# Patient Record
Sex: Male | Born: 1985 | Race: White | Hispanic: No | Marital: Married | Smoking: Never smoker
Health system: Southern US, Community
[De-identification: ages and names within clinical notes are randomized; demographics above are authoritative.]

## PROBLEM LIST (undated history)

## (undated) DIAGNOSIS — R0981 Nasal congestion: Secondary | ICD-10-CM

## (undated) DIAGNOSIS — K219 Gastro-esophageal reflux disease without esophagitis: Secondary | ICD-10-CM

## (undated) HISTORY — PX: WISDOM TOOTH EXTRACTION: SHX21

---

## 2015-02-24 ENCOUNTER — Encounter: Payer: Self-pay | Admitting: *Deleted

## 2015-02-25 ENCOUNTER — Encounter
Admission: RE | Disposition: A | Discharge status: Home / Self Care | Payer: BC Managed Care – PPO | Source: Ambulatory Visit | Attending: Gastroenterology

## 2015-02-25 ENCOUNTER — Ambulatory Visit: Payer: BC Managed Care – PPO | Admitting: Student in an Organized Health Care Education/Training Program

## 2015-02-25 ENCOUNTER — Ambulatory Visit
Admission: RE | Admit: 2015-02-25 | Discharge: 2015-02-25 | Disposition: A | Discharge status: Home / Self Care | Payer: BC Managed Care – PPO | Source: Ambulatory Visit | Attending: Gastroenterology | Admitting: Gastroenterology

## 2015-02-25 ENCOUNTER — Encounter: Payer: Self-pay | Admitting: *Deleted

## 2015-02-25 DIAGNOSIS — K2950 Unspecified chronic gastritis without bleeding: Secondary | ICD-10-CM | POA: Insufficient documentation

## 2015-02-25 DIAGNOSIS — R109 Unspecified abdominal pain: Secondary | ICD-10-CM | POA: Diagnosis present

## 2015-02-25 DIAGNOSIS — Z79899 Other long term (current) drug therapy: Secondary | ICD-10-CM | POA: Insufficient documentation

## 2015-02-25 DIAGNOSIS — K219 Gastro-esophageal reflux disease without esophagitis: Secondary | ICD-10-CM | POA: Insufficient documentation

## 2015-02-25 HISTORY — DX: Gastro-esophageal reflux disease without esophagitis: K21.9

## 2015-02-25 HISTORY — DX: Nasal congestion: R09.81

## 2015-02-25 HISTORY — PX: ESOPHAGOGASTRODUODENOSCOPY: SHX5428

## 2015-02-25 SURGERY — EGD (ESOPHAGOGASTRODUODENOSCOPY)
Anesthesia: Monitor Anesthesia Care | Wound class: Clean Contaminated

## 2015-02-25 MED ORDER — LIDOCAINE HCL (CARDIAC) 20 MG/ML IV SOLN
INTRAVENOUS | Status: DC | PRN
  Administered 2015-02-25: 50 mg via INTRAVENOUS

## 2015-02-25 MED ORDER — LACTATED RINGERS IV SOLN
INTRAVENOUS | Status: DC
  Administered 2015-02-25: 11:00:00 via INTRAVENOUS

## 2015-02-25 MED ORDER — GLYCOPYRROLATE 0.2 MG/ML IJ SOLN
INTRAMUSCULAR | Status: DC | PRN
  Administered 2015-02-25: 0.1 mg via INTRAVENOUS

## 2015-02-25 MED ORDER — SIMETHICONE 40 MG/0.6ML PO SUSP
ORAL | Status: DC | PRN
  Administered 2015-02-25: 12:00:00

## 2015-02-25 MED ORDER — LACTATED RINGERS IV SOLN: 500.0000 mL | INTRAVENOUS | Status: DC

## 2015-02-25 MED ORDER — PROPOFOL 10 MG/ML IV BOLUS
INTRAVENOUS | Status: DC | PRN
  Administered 2015-02-25: 150 mg via INTRAVENOUS
  Administered 2015-02-25: 20 mg via INTRAVENOUS
  Administered 2015-02-25: 50 mg via INTRAVENOUS

## 2015-02-25 SURGICAL SUPPLY — 41 items
BALLN DILATOR 10-12 8 (BALLOONS)
BALLN DILATOR 12-15 8 (BALLOONS)
BALLN DILATOR 15-18 8 (BALLOONS)
BALLN DILATOR CRE 0-12 8 (BALLOONS)
BALLN DILATOR ESOPH 8 10 CRE (MISCELLANEOUS) IMPLANT
BALLOON DILATOR 12-15 8 (BALLOONS) IMPLANT
BALLOON DILATOR 15-18 8 (BALLOONS) IMPLANT
BALLOON DILATOR CRE 0-12 8 (BALLOONS) IMPLANT
BLOCK BITE 60FR ADLT L/F GRN (MISCELLANEOUS) ×3 IMPLANT
CANISTER SUCT 1200ML W/VALVE (MISCELLANEOUS) ×3 IMPLANT
FCP ESCP3.2XJMB 240X2.8X (MISCELLANEOUS)
FORCEPS BIOP RAD 4 LRG CAP 4 (CUTTING FORCEPS) ×3 IMPLANT
FORCEPS BIOP RJ4 240 W/NDL (MISCELLANEOUS)
FORCEPS ESCP3.2XJMB 240X2.8X (MISCELLANEOUS) IMPLANT
GOWN CVR UNV OPN BCK APRN NK (MISCELLANEOUS) ×1 IMPLANT
GOWN ISOL THUMB LOOP REG UNIV (MISCELLANEOUS) ×2
GOWN STRL REUS W/ TWL LRG LVL3 (GOWN DISPOSABLE) ×1 IMPLANT
GOWN STRL REUS W/TWL LRG LVL3 (GOWN DISPOSABLE) ×2
HEMOCLIP INSTINCT (CLIP) IMPLANT
INJECTOR VARIJECT VIN23 (MISCELLANEOUS) IMPLANT
KIT CO2 TUBING (TUBING) IMPLANT
KIT DEFENDO VALVE AND CONN (KITS) IMPLANT
KIT ENDO PROCEDURE OLY (KITS) ×3 IMPLANT
LIGATOR MULTIBAND 6SHOOTER MBL (MISCELLANEOUS) IMPLANT
MARKER SPOT ENDO TATTOO 5ML (MISCELLANEOUS) IMPLANT
PAD GROUND ADULT SPLIT (MISCELLANEOUS) IMPLANT
SNARE SHORT THROW 13M SML OVAL (MISCELLANEOUS) IMPLANT
SNARE SHORT THROW 30M LRG OVAL (MISCELLANEOUS) IMPLANT
SPOT EX ENDOSCOPIC TATTOO (MISCELLANEOUS)
SUCTION POLY TRAP 4CHAMBER (MISCELLANEOUS) IMPLANT
SYR INFLATION 60ML (SYRINGE) IMPLANT
TRAP SUCTION POLY (MISCELLANEOUS) IMPLANT
TUBING CONN 6MMX3.1M (TUBING)
TUBING SUCTION CONN 0.25 STRL (TUBING) IMPLANT
UNDERPAD 30X60 958B10 (PK) (MISCELLANEOUS) IMPLANT
VALVE BIOPSY ENDO (VALVE) IMPLANT
VARIJECT INJECTOR VIN23 (MISCELLANEOUS)
WATER AUXILLARY (MISCELLANEOUS) IMPLANT
WATER STERILE IRR 250ML POUR (IV SOLUTION) ×3 IMPLANT
WATER STERILE IRR 500ML POUR (IV SOLUTION) IMPLANT
WIRE CRE 18-20MM 8CM F G (MISCELLANEOUS) IMPLANT

## 2015-02-25 NOTE — Anesthesia Preprocedure Evaluation (Signed)
Anesthesia Evaluation  Patient identified by MRN, date of birth, ID band Patient awake    Reviewed: Allergy & Precautions, H&P , NPO status , Patient's Chart, lab work & pertinent test results, reviewed documented beta blocker date and time   Airway Mallampati: II  TM Distance: >3 FB Neck ROM: full    Dental no notable dental hx.    Pulmonary neg pulmonary ROS,    Pulmonary exam normal breath sounds clear to auscultation       Cardiovascular Exercise Tolerance: Good negative cardio ROS   Rhythm:regular Rate:Normal     Neuro/Psych negative neurological ROS  negative psych ROS   GI/Hepatic Neg liver ROS, GERD  Medicated,  Endo/Other  negative endocrine ROS  Renal/GU negative Renal ROS  negative genitourinary   Musculoskeletal   Abdominal   Peds  Hematology negative hematology ROS (+)   Anesthesia Other Findings   Reproductive/Obstetrics negative OB ROS                             Anesthesia Physical Anesthesia Plan  ASA: II  Anesthesia Plan: MAC   Post-op Pain Management:    Induction:   Airway Management Planned:   Additional Equipment:   Intra-op Plan:   Post-operative Plan:   Informed Consent: I have reviewed the patients History and Physical, chart, labs and discussed the procedure including the risks, benefits and alternatives for the proposed anesthesia with the patient or authorized representative who has indicated his/her understanding and acceptance.     Plan Discussed with: CRNA  Anesthesia Plan Comments:         Anesthesia Quick Evaluation

## 2015-02-25 NOTE — Op Note (Signed)
[facility] Patient Name: Raymond Mooney Procedure Date: 02/25/2015 11:23 AM MRN: [id number] Account #: [id number] Date of Birth: 1985-07-19 Admit Type: Outpatient Age: 29 Room: [facility] OR ROOM 01 Gender: Male Note Status: Finalized Procedure:         Upper GI endoscopy Indications:       Abdominal pain, Suspected esophageal reflux, Nausea Providers:         Ezzard Standing. Bluford Kaufmann, MD Medicines:         Monitored Anesthesia Care Complications:     No immediate complications. Procedure:         Pre-Anesthesia Assessment:                    - Prior to the procedure, a History and Physical was                     performed, and patient medications, allergies and                     sensitivities were reviewed. The patient's tolerance of                     previous anesthesia was reviewed.                    - The risks and benefits of the procedure and the sedation                     options and risks were discussed with the patient. All                     questions were answered and informed consent was obtained.                    - After reviewing the risks and benefits, the patient was                     deemed in satisfactory condition to undergo the procedure.                    After obtaining informed consent, the endoscope was passed                     under direct vision. Throughout the procedure, the                     patient's blood pressure, pulse, and oxygen saturations                     were monitored continuously. The Olympus GIF H180J                     endoscope (S#: [id number]) was introduced through the mouth,                     and advanced to the second part of duodenum. The upper GI                     endoscopy was accomplished without difficulty. The patient                     tolerated the procedure well. Findings:      The examined esophagus was normal. Biopsies were taken with a cold       forceps for  histology.      The  entire examined stomach was normal. Biopsies were taken with a cold       forceps for histology.      The examined duodenum was normal. Impression:        - Normal esophagus. Biopsied.                    - Normal stomach. Biopsied.                    - Normal examined duodenum. Recommendation:    - Discharge patient to home.                    - Observe patient's clinical course.                    - Await pathology results.                    - Continue present medications.                    - The findings and recommendations were discussed with the                     patient. Procedure Code(s): --- Professional ---                    [phone], Esophagogastroduodenoscopy, flexible, transoral;                     with biopsy, single or multiple Diagnosis Code(s): --- Professional ---                    R10.9, Unspecified abdominal pain                    R11.0, Nausea CPT copyright 2014 American Medical Association. All rights reserved. The codes documented in this report are preliminary and upon coder review may  be revised to meet current compliance requirements. Wallace CullensPaul Y Auren Valdes, MD 02/25/2015 11:39:24 AM This report has been signed electronically. Number of Addenda: 0 Note Initiated On: 02/25/2015 11:23 AM Total Procedure Duration: 0 hours 2 minutes 12 seconds       [facility]

## 2015-02-25 NOTE — Discharge Instructions (Signed)

## 2015-02-25 NOTE — Anesthesia Postprocedure Evaluation (Signed)
Anesthesia Post Note  Patient: Raymond Mooney  Procedure(s) Performed: Procedure(s) (LRB): ESOPHAGOGASTRODUODENOSCOPY (EGD) (N/A)  Patient location during evaluation: PACU Anesthesia Type: MAC Level of consciousness: awake and alert Pain management: pain level controlled Vital Signs Assessment: post-procedure vital signs reviewed and stable Respiratory status: spontaneous breathing, nonlabored ventilation, respiratory function stable and patient connected to nasal cannula oxygen Cardiovascular status: blood pressure returned to baseline and stable Postop Assessment: no signs of nausea or vomiting Anesthetic complications: no    DANIEL D KOVACS

## 2015-02-25 NOTE — Anesthesia Procedure Notes (Signed)
Procedure Name: MAC Performed by: Yvette Loveless Pre-anesthesia Checklist: Patient identified, Emergency Drugs available, Suction available, Timeout performed and Patient being monitored Patient Re-evaluated:Patient Re-evaluated prior to inductionOxygen Delivery Method: Nasal cannula Placement Confirmation: positive ETCO2     

## 2015-02-25 NOTE — Transfer of Care (Signed)
Immediate Anesthesia Transfer of Care Note  Patient: Raymond Mooney  Procedure(s) Performed: Procedure(s): ESOPHAGOGASTRODUODENOSCOPY (EGD) (N/A)  Patient Location: PACU  Anesthesia Type: MAC  Level of Consciousness: awake, alert  and patient cooperative  Airway and Oxygen Therapy: Patient Spontanous Breathing and Patient connected to supplemental oxygen  Post-op Assessment: Post-op Vital signs reviewed, Patient's Cardiovascular Status Stable, Respiratory Function Stable, Patent Airway and No signs of Nausea or vomiting  Post-op Vital Signs: Reviewed and stable  Complications: No apparent anesthesia complications

## 2015-02-25 NOTE — H&P (Signed)
  Date of Initial H&P: 02/18/2015  History reviewed, patient examined, no change in status, stable for surgery. 

## 2015-02-26 ENCOUNTER — Encounter: Payer: Self-pay | Admitting: Gastroenterology

## 2015-02-27 LAB — SURGICAL PATHOLOGY

## 2015-03-12 ENCOUNTER — Other Ambulatory Visit: Payer: Self-pay | Admitting: Gastroenterology

## 2015-03-12 DIAGNOSIS — R109 Unspecified abdominal pain: Secondary | ICD-10-CM

## 2015-03-17 ENCOUNTER — Ambulatory Visit
Admission: RE | Admit: 2015-03-17 | Discharge: 2015-03-17 | Disposition: A | Discharge status: Home / Self Care | Payer: BC Managed Care – PPO | Source: Ambulatory Visit | Attending: Gastroenterology | Admitting: Gastroenterology

## 2015-03-17 DIAGNOSIS — R109 Unspecified abdominal pain: Secondary | ICD-10-CM | POA: Insufficient documentation

## 2015-03-17 MED ORDER — IOHEXOL 350 MG/ML SOLN
100.0000 mL | Freq: Once | INTRAVENOUS | Status: AC | PRN
  Administered 2015-03-17: 100 mL via INTRAVENOUS

## 2016-06-12 IMAGING — CT CT ABD-PELV W/ CM
2 of 4 series · 17 of 46 positions shown, 19 images · IV contrast (omnipaque)
Comparison: None available

CLINICAL DATA: Persistent nausea and lower abdominal pain for 3
months.

EXAM:
CT ABDOMEN AND PELVIS WITH CONTRAST
TECHNIQUE: Multidetector CT imaging of the abdomen and pelvis was performed
using the standard protocol following bolus administration of
intravenous contrast.
CONTRAST:  100mL OMNIPAQUE IOHEXOL 350 MG/ML SOLN

[Series 2: routine with · axial · 0.86mm/px · z∈[-1046,-590]mm · 14 of 101 slices shown, 16 images]
[im 5/101  soft-tissue]
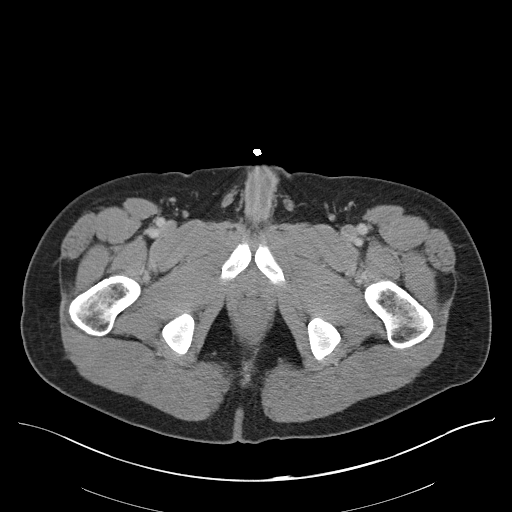
[im 5/101  bone]
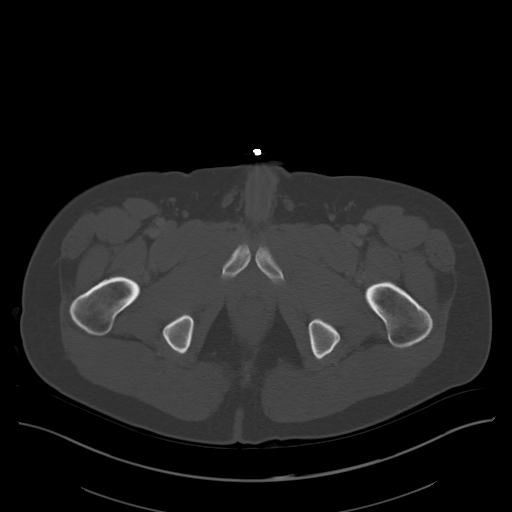
[im 14/101  soft-tissue]
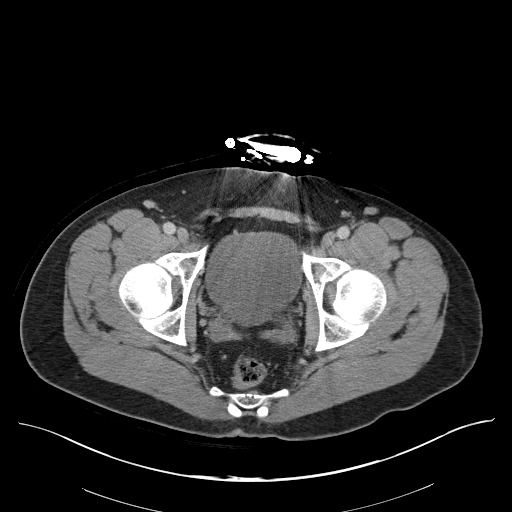
[im 19/101  soft-tissue]
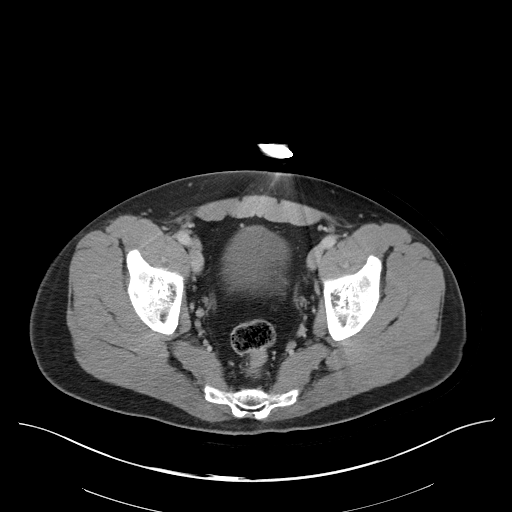
[im 28/101  soft-tissue]
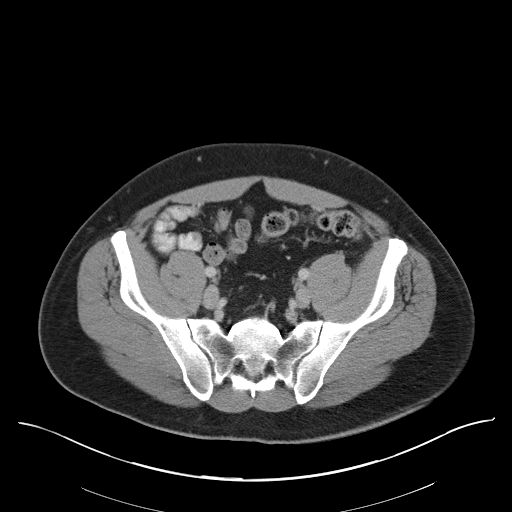
[im 32/101  soft-tissue]
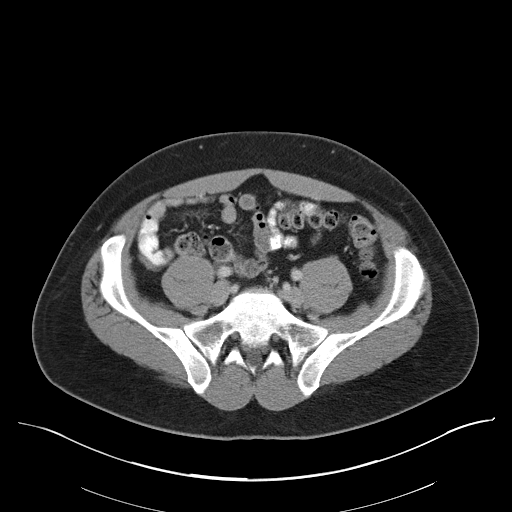
[im 41/101  soft-tissue]
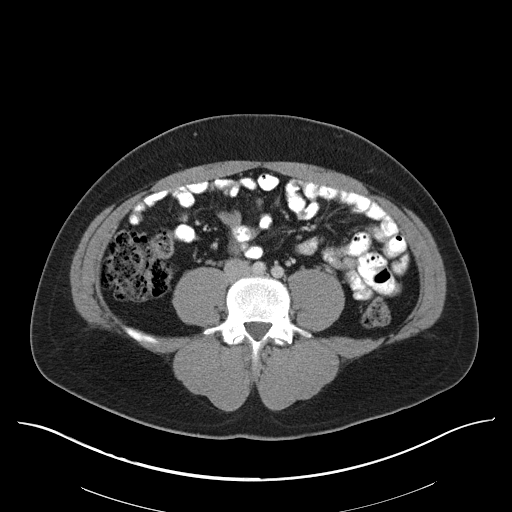
[im 46/101  soft-tissue]
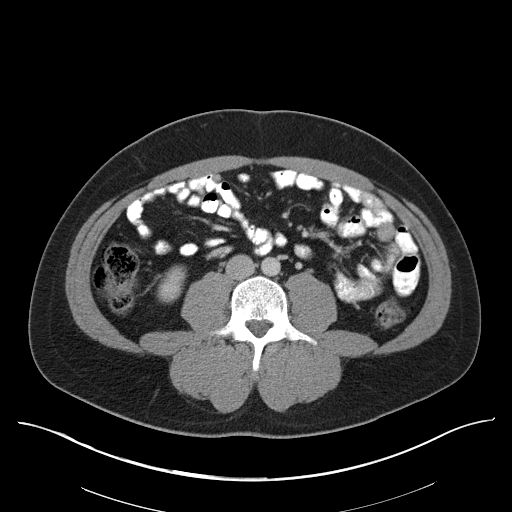
[im 55/101  soft-tissue]
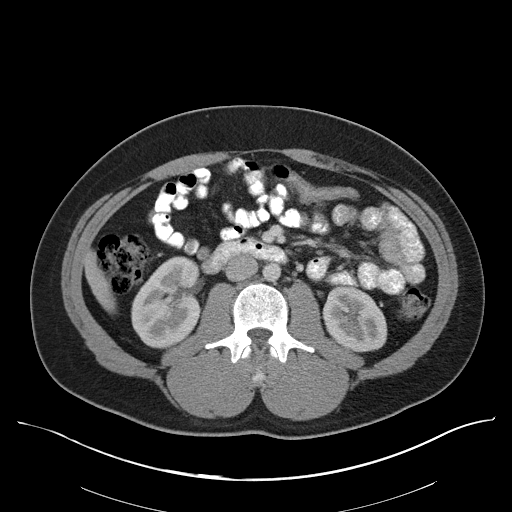
[im 60/101  soft-tissue]
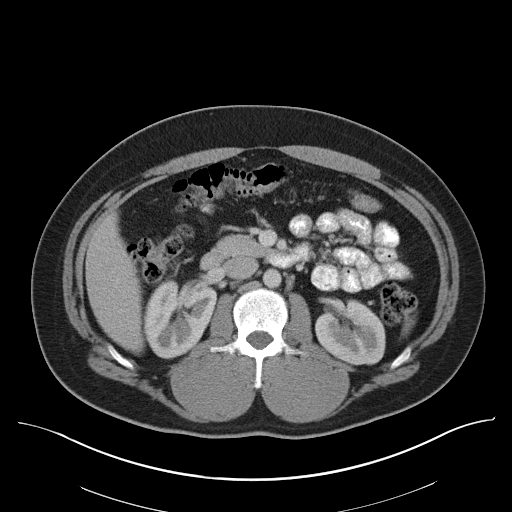
[im 60/101  bone]
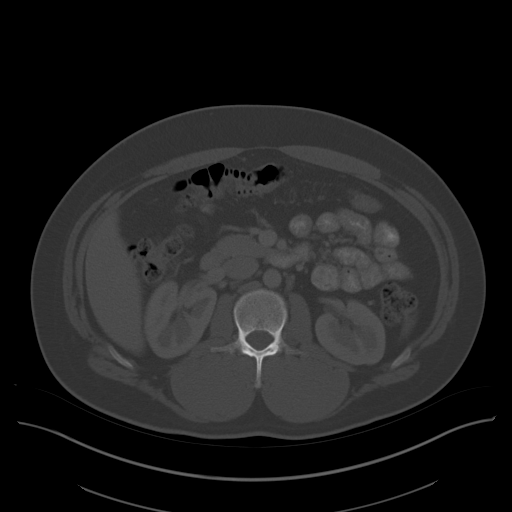
[im 69/101  soft-tissue]
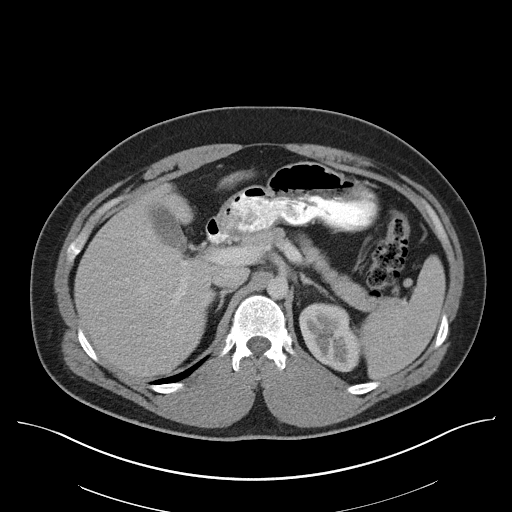
[im 73/101  soft-tissue]
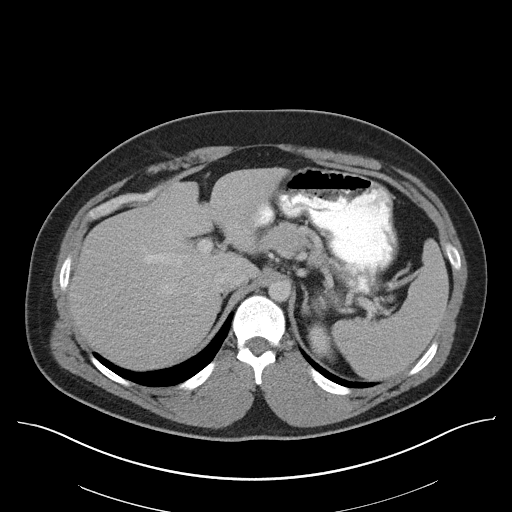
[im 82/101  soft-tissue]
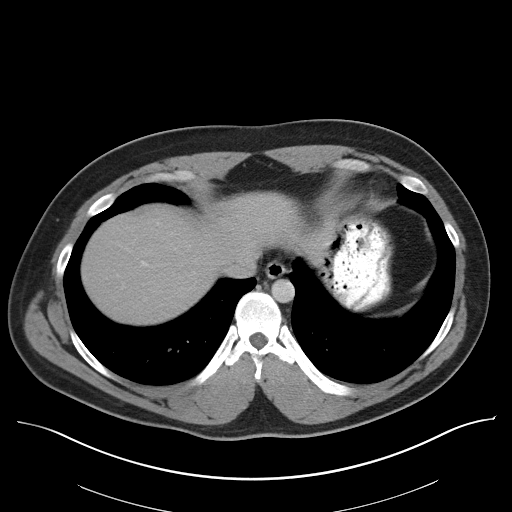
[im 87/101  soft-tissue]
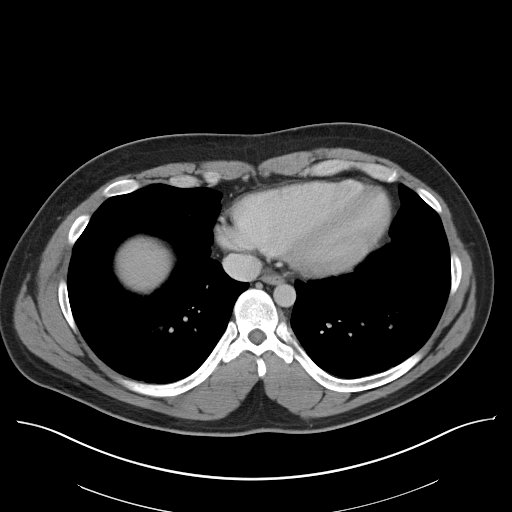
[im 96/101  soft-tissue]
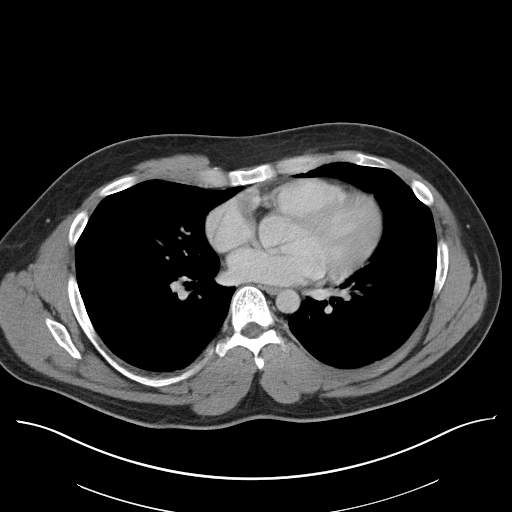

[Series 5: cor routine with · coronal · 0.88mm/px · 3 of 159 slices shown]
[im 53/159  soft-tissue]
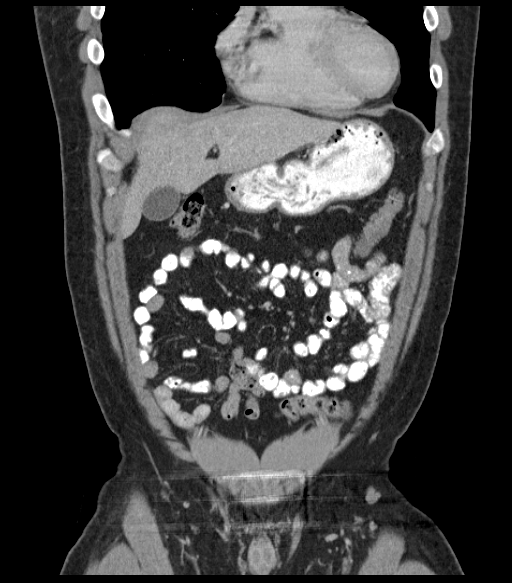
[im 71/159  soft-tissue]
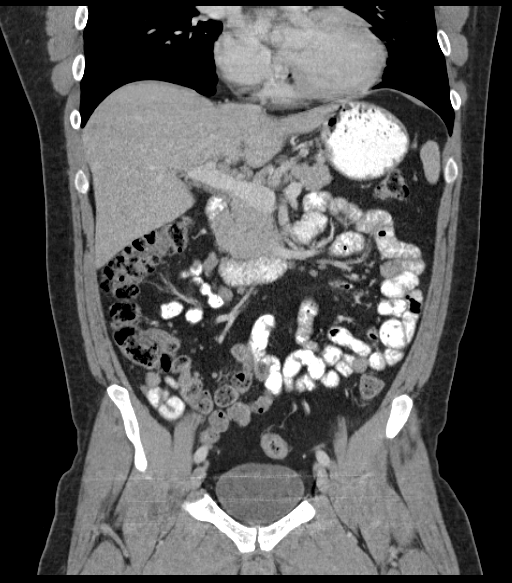
[im 88/159  soft-tissue]
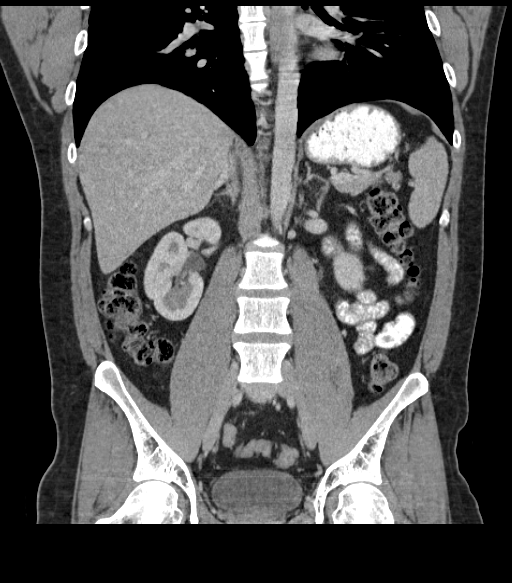

[17 of 46 positions shown; findings below may reference images not displayed]

FINDINGS: Lower chest:  No acute findings.

Hepatobiliary: No masses or other significant abnormality.

Pancreas: No mass, inflammatory changes, or other significant
abnormality.

Spleen: Within normal limits in size and appearance.

Adrenals/Urinary Tract: No masses identified. No evidence of
hydronephrosis.

Stomach/Bowel: Negative for bowel obstruction, significant
dilatation, ileus, or free air. Portion of the transverse colon are
underdistended. Normal appendix demonstrated. No acute inflammatory
process, fluid collection or abscess.

Vascular/Lymphatic: No pathologically enlarged lymph nodes. No
evidence of abdominal aortic aneurysm.

Reproductive: No mass or other significant abnormality.

Other: No inguinal abnormality or hernia.  Intact abdominal wall.

Musculoskeletal: No acute or abnormal osseous finding. External
artifact overlies anterior pelvis.
IMPRESSION: No acute intra-abdominal or pelvic finding.

## 2018-05-30 ENCOUNTER — Telehealth: Payer: BC Managed Care – PPO | Admitting: Family Medicine

## 2018-05-30 ENCOUNTER — Encounter: Payer: Self-pay | Admitting: Family Medicine

## 2018-05-30 DIAGNOSIS — R509 Fever, unspecified: Secondary | ICD-10-CM

## 2018-05-30 DIAGNOSIS — R519 Headache, unspecified: Secondary | ICD-10-CM

## 2018-05-30 DIAGNOSIS — Z7189 Other specified counseling: Secondary | ICD-10-CM

## 2018-05-30 DIAGNOSIS — R51 Headache: Secondary | ICD-10-CM

## 2018-05-30 NOTE — Progress Notes (Signed)
Based on your current symptoms, it seems unlikely that your symptoms are related to the Coronavirus.    At least 5-10 min. Were spent on this patient encounter through record and history review, suspected diagnosis research and response to patient.  Coronavirus disease 2019 (COVID-19) is a respiratory illness that can spread from person to person. The virus that causes COVID-19 is a new virus that was first identified in the country of [place] but is now found in multiple other countries and has spread to the [place].  Symptoms associated with the virus are mild to severe fever, cough, and shortness of breath. There is currently no vaccine to protect against COVID-19, and there is no specific antiviral treatment for the virus.   To be considered HIGH RISK for Coronavirus (COVID-19), you have to meet the following criteria:  . Traveled to [place], [place], [place], [place] or [place]; or in the [place] to [place], [place], [place], or [place]; and have fever, cough, and shortness of breath within the last 2 weeks of travel OR  . Been in close contact with a person diagnosed with COVID-19 within the last 2 weeks and have fever, cough, and shortness of breath  . IF YOU DO NOT MEET THESE CRITERIA, YOU ARE CONSIDERED LOW RISK FOR COVID-19.   It is vitally important that if you feel that you have an infection such as this virus or any other virus that you stay home and away from places where you may spread it to others.  You should self-quarantine for 14 days if you have symptoms that could potentially be coronavirus and avoid contact with people age 86 and older.   You may also take acetaminophen (Tylenol) as needed for fever, and headache.   Reduce your risk of any infection by using the same precautions used for avoiding the common cold or flu:  Marland Kitchen Wash your hands often with soap and warm water for at least 20 seconds.  If soap and water are not readily available, use an alcohol-based  hand sanitizer with at least 60% alcohol.  . If coughing or sneezing, cover your mouth and nose by coughing or sneezing into the elbow areas of your shirt or coat, into a tissue or into your sleeve (not your hands). . Avoid shaking hands with others and consider head nods or verbal greetings only. . Avoid touching your eyes, nose, or mouth with unwashed hands.  . Avoid close contact with people who are sick. . Avoid places or events with large numbers of people in one location, like concerts or sporting events. . Carefully consider travel plans you have or are making. . If you are planning any travel outside or inside the [place], visit the CDC's Travelers' Health webpage for the latest health notices. . If you have some symptoms but not all symptoms, continue to monitor at home and seek medical attention if your symptoms worsen. . If you are having a medical emergency, call 911.  HOME CARE . Only take medications as instructed by your medical team. . Drink plenty of fluids and get plenty of rest. . A steam or ultrasonic humidifier can help if you have congestion.   GET HELP RIGHT AWAY IF: . You develop worsening fever. . You become short of breath . You cough up blood. . Your symptoms become more severe MAKE SURE YOU   Understand these instructions.  Will watch your condition.  Will get help right away if you are not doing well  or get worse.  Your e-visit answers were reviewed by a board certified advanced clinical practitioner to complete your personal care plan.  Depending on the condition, your plan could have included both over the counter or prescription medications.  If there is a problem please reply once you have received a response from your provider. Your safety is important to [place].  If you have drug allergies check your prescription carefully.    You can use MyChart to ask questions about today's visit, request a non-urgent call back, or ask for a work or school excuse for 24  hours related to this e-Visit. If it has been greater than 24 hours you will need to follow up with your provider, or enter a new e-Visit to address those concerns. You will get an e-mail in the next two days asking about your experience.  I hope that your e-visit has been valuable and will speed your recovery. Thank you for using e-visits.

## 2018-05-30 NOTE — Progress Notes (Signed)
For covid in the first message you are considered low risk and asked to shelter in place. For the additional symptoms of abdominal pain, mouth and tongue itching and for potential pesticide exposure these are best examined in a face to face visit. I apologize for any confusion- please seek face to face care at the locations provided in the previous message, your PCP or health care facility of your choosing.

## 2018-05-30 NOTE — Progress Notes (Signed)
Based on what you shared with me about the pesticide exposure, I feel your condition warrants further evaluation and I recommend that you be seen for a face to face office visit. For patients who have not traveled and who are essentially low risk for covid we are recommending sheltering in place and managing symptoms at home.   Since your exposure and symptoms are beyond this If you are concerned and feel you need evaluation, face to face is the option that I would recommended below you will see some options.    If you are having a true medical emergency please call 911.  If you need an urgent face to face visit, [facility] has four urgent care centers for your convenience.    PLEASE NOTE: THE [facility] LOCATIONS AND URGENT CARE CLINICS DO NOT HAVE THE TESTING FOR CORONAVIRUS COVID19 AVAILABLE.  IF YOU FEEL YOU NEED THIS TEST YOU MUST HAVE AN ORDER TO GO TO A TESTING LOCATION FROM YOUR PROVIDER OR FROM A SCREENING E-VISIT    The following sites will take your insurance:  . [facility]  [phone] Get Driving Directions Find a Provider at this Location  [address] [place], [place] [address] . 10 am to 8 pm Monday-Friday . 12 pm to 8 pm Saturday-Sunday   . [facility] at [facility]  [phone] Get Driving Directions Find a Provider at this Location  [address] [place], [place] [address] . 8 am to 8 pm Monday-Friday . 9 am to 6 pm Saturday . 11 am to 6 pm Sunday   . [facility] at [facility]  [phone] Get Driving Directions  [address] [place], [place] [address] . 8 am to 8 pm Monday-Friday . 8 am to 4 pm Saturday-Sunday   Your e-visit answers were reviewed by a board certified advanced clinical practitioner to complete your personal care plan.  Thank you for using e-Visits.

## 2022-08-01 ENCOUNTER — Ambulatory Visit
Admission: EM | Admit: 2022-08-01 | Discharge: 2022-08-01 | Disposition: A | Discharge status: Home / Self Care | Payer: BC Managed Care – PPO | Attending: Emergency Medicine | Admitting: Emergency Medicine

## 2022-08-01 ENCOUNTER — Encounter: Payer: Self-pay | Admitting: Emergency Medicine

## 2022-08-01 DIAGNOSIS — J020 Streptococcal pharyngitis: Secondary | ICD-10-CM

## 2022-08-01 LAB — POCT RAPID STREP A (OFFICE): Rapid Strep A Screen: POSITIVE — AB

## 2022-08-01 MED ORDER — AMOXICILLIN 500 MG PO CAPS: 500.0000 mg | ORAL_CAPSULE | Freq: Two times a day (BID) | ORAL | 0 refills | Status: AC

## 2022-08-01 NOTE — ED Provider Notes (Signed)
Renaldo Fiddler    CSN: [id number] Arrival date & time: 08/01/22  1534      History   Chief Complaint Chief Complaint  Patient presents with   Sore Throat    Entered by patient    HPI Raymond Mooney is a 37 y.o. male.  Patient presents with 3-day history of sore throat and headache.  Treating with Tylenol; last dose at 1500 today.  No fever, rash, cough, shortness of breath, or other symptoms.  His medical history includes GERD.  No known sick contacts but patient is an Tourist information centre manager.  The history is provided by the patient and medical records.    Past Medical History:  Diagnosis Date   GERD (gastroesophageal reflux disease)    Head congestion     There are no problems to display for this patient.   Past Surgical History:  Procedure Laterality Date   ESOPHAGOGASTRODUODENOSCOPY N/A 02/25/2015   Procedure: ESOPHAGOGASTRODUODENOSCOPY (EGD);  Surgeon: Wallace Cullens, MD;  [facility]: [facility];  Service: Gastroenterology;  Laterality: N/A;   WISDOM TOOTH EXTRACTION         Home Medications    Prior to Admission medications   Medication Sig Start Date End Date Taking? Authorizing Provider  amoxicillin (AMOXIL) 500 MG capsule Take 1 capsule (500 mg total) by mouth 2 (two) times daily for 10 days. 08/01/22 08/11/22 Yes Mickie Bail, NP  hyoscyamine (LEVSIN, ANASPAZ) 0.125 MG tablet Take 0.125 mg by mouth every 6 (six) hours as needed.    [provider]  ondansetron (ZOFRAN-ODT) 4 MG disintegrating tablet Take 4 mg by mouth every 8 (eight) hours as needed for nausea or vomiting.    [provider]  pantoprazole (PROTONIX) 40 MG tablet Take 40 mg by mouth 2 (two) times daily.    [provider]    Family History History reviewed. No pertinent family history.  Social History Social History   Tobacco Use   Smoking status: Never  Substance Use Topics   Alcohol use: No     Allergies   Patient has no known  allergies.   Review of Systems Review of Systems  Constitutional:  Negative for chills and fever.  HENT:  Positive for sore throat. Negative for ear pain.   Respiratory:  Negative for cough and shortness of breath.   Cardiovascular:  Negative for chest pain and palpitations.  Gastrointestinal:  Negative for diarrhea and vomiting.  Genitourinary:  Negative for dysuria and hematuria.  Skin:  Negative for rash.  Neurological:  Positive for headaches.  All other systems reviewed and are negative.    Physical Exam Triage Vital Signs ED Triage Vitals  Enc Vitals Group     BP      Pulse      Resp      Temp      Temp src      SpO2      Weight      Height      Head Circumference      Peak Flow      Pain Score      Pain Loc      Pain Edu?      Excl. in GC?    No data found.  Updated Vital Signs BP 130/82   Pulse 82   Temp 98.5 F (36.9 C)   Resp 18   SpO2 98%   Visual Acuity Right Eye Distance:   Left Eye Distance:   Bilateral Distance:  Right Eye Near:   Left Eye Near:    Bilateral Near:     Physical Exam Vitals and nursing note reviewed.  Constitutional:      General: He is not in acute distress.    Appearance: Normal appearance. He is well-developed. He is not ill-appearing.  HENT:     Right Ear: Tympanic membrane normal.     Left Ear: Tympanic membrane normal.     Nose: Nose normal.     Mouth/Throat:     Mouth: Mucous membranes are moist.     Pharynx: Posterior oropharyngeal erythema present.  Cardiovascular:     Rate and Rhythm: Normal rate and regular rhythm.     Heart sounds: Normal heart sounds.  Pulmonary:     Effort: Pulmonary effort is normal. No respiratory distress.     Breath sounds: Normal breath sounds.  Musculoskeletal:     Cervical back: Neck supple.  Skin:    General: Skin is warm and dry.  Neurological:     Mental Status: He is alert.  Psychiatric:        Mood and Affect: Mood normal.        Behavior: Behavior normal.       UC Treatments / Results  Labs (all labs ordered are listed, but only abnormal results are displayed) Labs Reviewed  POCT RAPID STREP A (OFFICE) - Abnormal; Notable for the following components:      Result Value   Rapid Strep A Screen Positive (*)    All other components within normal limits    EKG   Radiology No results found.  Procedures Procedures (including critical care time)  Medications Ordered in UC Medications - No data to display  Initial Impression / Assessment and Plan / UC Course  I have reviewed the triage vital signs and the nursing notes.  Pertinent labs & imaging results that were available during my care of the patient were reviewed by me and considered in my medical decision making (see chart for details).    Strep pharyngitis.  Rapid strep positive.  Treating with amoxicillin.  Discussed symptomatic treatment including Tylenol or ibuprofen.  Instructed patient to follow up with his PCP if his symptoms are not improving.  He agrees to plan of care.   Final Clinical Impressions(s) / UC Diagnoses   Final diagnoses:  Strep pharyngitis     Discharge Instructions      Take the amoxicillin as directed for strep throat.  Follow up with your primary care provider if your symptoms are not improving.        ED Prescriptions     Medication Sig Dispense Auth. Provider   amoxicillin (AMOXIL) 500 MG capsule Take 1 capsule (500 mg total) by mouth 2 (two) times daily for 10 days. 20 capsule Mickie Bail, NP      PDMP not reviewed this encounter.   Mickie Bail, NP 08/01/22 1622

## 2022-08-01 NOTE — ED Triage Notes (Signed)
Patient triaged by provider. 

## 2022-08-01 NOTE — Discharge Instructions (Addendum)
Take the amoxicillin as directed for strep throat.    Follow up with your primary care provider if your symptoms are not improving.
